# Patient Record
Sex: Male | Born: 1960 | Hispanic: No | Marital: Married | Smoking: Never smoker
Health system: Southern US, Community
[De-identification: ages and names within clinical notes are randomized; demographics above are authoritative.]

---

## 2014-10-27 ENCOUNTER — Ambulatory Visit: Payer: Medicaid Other | Attending: Internal Medicine | Admitting: Internal Medicine

## 2014-10-27 ENCOUNTER — Ambulatory Visit (HOSPITAL_COMMUNITY)
Admission: RE | Admit: 2014-10-27 | Discharge: 2014-10-27 | Disposition: A | Discharge status: Home / Self Care | Payer: Medicaid Other | Source: Ambulatory Visit | Attending: Internal Medicine | Admitting: Internal Medicine

## 2014-10-27 ENCOUNTER — Encounter: Payer: Self-pay | Admitting: Internal Medicine

## 2014-10-27 VITALS — BP 134/76 | HR 68 | Temp 98.0°F | Resp 16 | Wt 213.2 lb

## 2014-10-27 DIAGNOSIS — Z833 Family history of diabetes mellitus: Secondary | ICD-10-CM | POA: Diagnosis not present

## 2014-10-27 DIAGNOSIS — Z8249 Family history of ischemic heart disease and other diseases of the circulatory system: Secondary | ICD-10-CM | POA: Diagnosis not present

## 2014-10-27 DIAGNOSIS — Z0000 Encounter for general adult medical examination without abnormal findings: Secondary | ICD-10-CM | POA: Diagnosis present

## 2014-10-27 DIAGNOSIS — M25561 Pain in right knee: Secondary | ICD-10-CM | POA: Insufficient documentation

## 2014-10-27 DIAGNOSIS — L600 Ingrowing nail: Secondary | ICD-10-CM | POA: Diagnosis not present

## 2014-10-27 DIAGNOSIS — R351 Nocturia: Secondary | ICD-10-CM | POA: Insufficient documentation

## 2014-10-27 DIAGNOSIS — M79674 Pain in right toe(s): Secondary | ICD-10-CM | POA: Insufficient documentation

## 2014-10-27 DIAGNOSIS — M79676 Pain in unspecified toe(s): Secondary | ICD-10-CM

## 2014-10-27 DIAGNOSIS — G8929 Other chronic pain: Secondary | ICD-10-CM | POA: Diagnosis not present

## 2014-10-27 DIAGNOSIS — R350 Frequency of micturition: Secondary | ICD-10-CM | POA: Insufficient documentation

## 2014-10-27 DIAGNOSIS — Z139 Encounter for screening, unspecified: Secondary | ICD-10-CM

## 2014-10-27 DIAGNOSIS — Z1211 Encounter for screening for malignant neoplasm of colon: Secondary | ICD-10-CM

## 2014-10-27 LAB — CBC WITH DIFFERENTIAL/PLATELET
BASOS ABS: 0 10*3/uL (ref 0.0–0.1)
BASOS PCT: 0 % (ref 0–1)
EOS ABS: 0.2 10*3/uL (ref 0.0–0.7)
EOS PCT: 4 % (ref 0–5)
HEMATOCRIT: 40.2 % (ref 39.0–52.0)
Hemoglobin: 14.2 g/dL (ref 13.0–17.0)
Lymphocytes Relative: 46 % (ref 12–46)
Lymphs Abs: 2.2 10*3/uL (ref 0.7–4.0)
MCH: 30.5 pg (ref 26.0–34.0)
MCHC: 35.3 g/dL (ref 30.0–36.0)
MCV: 86.3 fL (ref 78.0–100.0)
Monocytes Absolute: 0.5 10*3/uL (ref 0.1–1.0)
Monocytes Relative: 10 % (ref 3–12)
Neutro Abs: 1.9 10*3/uL (ref 1.7–7.7)
Neutrophils Relative %: 40 % — ABNORMAL LOW (ref 43–77)
PLATELETS: 202 10*3/uL (ref 150–400)
RBC: 4.66 MIL/uL (ref 4.22–5.81)
RDW: 14.4 % (ref 11.5–15.5)
WBC: 4.8 10*3/uL (ref 4.0–10.5)

## 2014-10-27 LAB — LIPID PANEL
CHOL/HDL RATIO: 4.6 ratio
CHOLESTEROL: 231 mg/dL — AB (ref 0–200)
HDL: 50 mg/dL (ref >39)
LDL Cholesterol: 141 mg/dL — ABNORMAL HIGH (ref 0–99)
Triglycerides: 201 mg/dL — ABNORMAL HIGH (ref <150)
VLDL: 40 mg/dL (ref 0–40)

## 2014-10-27 LAB — COMPLETE METABOLIC PANEL WITH GFR
ALK PHOS: 56 U/L (ref 39–117)
ALT: 29 U/L (ref 0–53)
AST: 23 U/L (ref 0–37)
Albumin: 4.7 g/dL (ref 3.5–5.2)
BUN: 12 mg/dL (ref 6–23)
CALCIUM: 10 mg/dL (ref 8.4–10.5)
CHLORIDE: 102 meq/L (ref 96–112)
CO2: 25 mEq/L (ref 19–32)
Creat: 0.97 mg/dL (ref 0.50–1.35)
GFR, Est African American: >89 mL/min
GFR, Est Non African American: 89 mL/min
Glucose, Bld: 90 mg/dL (ref 70–99)
Potassium: 4.2 mEq/L (ref 3.5–5.3)
Sodium: 138 mEq/L (ref 135–145)
Total Bilirubin: 0.7 mg/dL (ref 0.2–1.2)
Total Protein: 7.2 g/dL (ref 6.0–8.3)

## 2014-10-27 LAB — HEMOGLOBIN A1C
HEMOGLOBIN A1C: 5.4 % (ref <5.7)
Mean Plasma Glucose: 108 mg/dL (ref <117)

## 2014-10-27 LAB — URIC ACID: Uric Acid, Serum: 6.9 mg/dL (ref 4.0–7.8)

## 2014-10-27 LAB — TSH: TSH: 3.059 u[IU]/mL (ref 0.350–4.500)

## 2014-10-27 MED ORDER — IBUPROFEN 600 MG PO TABS: 600.0000 mg | ORAL_TABLET | Freq: Three times a day (TID) | ORAL | Status: DC | PRN

## 2014-10-27 NOTE — Patient Instructions (Signed)
Ingrown Toenail An ingrown toenail occurs when the sharp edge of your toenail grows into the skin. Causes of ingrown toenails include toenails clipped too far back or poorly fitting shoes. Activities involving sudden stops (basketball, tennis) causing "toe jamming" may lead to an ingrown nail. HOME CARE INSTRUCTIONS   Soak the whole foot in warm soapy water for 20 minutes, 3 times per day.  You may lift the edge of the nail away from the sore skin by wedging a small piece of cotton under the corner of the nail. Be careful not to dig (traumatize) and cause more injury to the area.  Wear shoes that fit well. While the ingrown nail is causing problems, sandals may be beneficial.  Trim your toenails regularly and carefully. Cut your toenails straight across, not in a curve. This will prevent injury to the skin at the corners of the toenail.  Keep your feet clean and dry.  Crutches may be helpful early in treatment if walking is painful.  Antibiotics, if prescribed, should be taken as directed.  Return for a wound check in 2 days or as directed.  Only take over-the-counter or prescription medicines for pain, discomfort, or fever as directed by your caregiver. SEEK IMMEDIATE MEDICAL CARE IF:   You have a fever.  You have increasing pain, redness, swelling, or heat at the wound site.  Your toe is not better in 7 days. If conservative treatment is not successful, surgical removal of a portion or all of the nail may be necessary. MAKE SURE YOU:   Understand these instructions.  Will watch your condition.  Will get help right away if you are not doing well or get worse. Document Released: 12/05/2000 Document Revised: 03/01/2012 Document Reviewed: 11/29/2008 [employer] Patient Information 2015 [employer]. This information is not intended to replace advice given to you by your health care provider. Make sure you discuss any questions you have with your health care provider.  

## 2014-10-27 NOTE — Progress Notes (Signed)
Patient here to establish care Complains of pain to right knee and right great toe Takes no prescribed medications

## 2014-10-27 NOTE — Progress Notes (Signed)
Patient Demographics  Howard Cox, is a 53 y.o. male  ZOX:[id number]  [id number]  DOB - 06-Jun-1961  CC:  Chief Complaint  Patient presents with  . new patient       HPI: Howard Cox is a 53 y.o. male here today to establish medical care.patient reported to have right knee pain on and off for a few months, denies any fall or trauma does report history of trauma in his right foot and has swollen right big toe but denies any fever chills, denies any previous history of gout, patient does not take any medications, he reported to have some nocturia and urinary frequency denies any dysuria, patient does report family history of diabetes. Patient also complains of ingrown toenail in his right foot big toe. Patient has No headache, No chest pain, No abdominal pain - No Nausea, No new weakness tingling or numbness, No Cough - SOB.  No Known Allergies History reviewed. No pertinent past medical history. No current outpatient prescriptions on file prior to visit.   No current facility-administered medications on file prior to visit.   Family History  Problem Relation Age of Onset  . Diabetes Mother   . Heart disease Father   . Hypertension Father    History   Social History  . Marital Status: Married    Spouse Name: N/A    Number of Children: N/A  . Years of Education: N/A   Occupational History  . Not on file.   Social History Main Topics  . Smoking status: Never Smoker   . Smokeless tobacco: Not on file  . Alcohol Use: No  . Drug Use: Not on file  . Sexual Activity: Not on file   Other Topics Concern  . Not on file   Social History Narrative  . No narrative on file    Review of Systems: Constitutional: Negative for fever, chills, diaphoresis, activity change, appetite change and fatigue. HENT: Negative for ear pain, nosebleeds, congestion, facial swelling, rhinorrhea, neck pain, neck stiffness and ear discharge.  Eyes: Negative for pain, discharge,  redness, itching and visual disturbance. Respiratory: Negative for cough, choking, chest tightness, shortness of breath, wheezing and stridor.  Cardiovascular: Negative for chest pain, palpitations and leg swelling. Gastrointestinal: Negative for abdominal distention. Genitourinary: Negative for dysuria, urgency, frequency, hematuria, flank pain, decreased urine volume, difficulty urinating and dyspareunia.  Musculoskeletal: Negative for back pain, joint swelling, arthralgia and gait problem. Neurological: Negative for dizziness, tremors, seizures, syncope, facial asymmetry, speech difficulty, weakness, light-headedness, numbness and headaches.  Hematological: Negative for adenopathy. Does not bruise/bleed easily. Psychiatric/Behavioral: Negative for hallucinations, behavioral problems, confusion, dysphoric mood, decreased concentration and agitation.    Objective:   Filed Vitals:   10/27/14 1110  BP: 134/76  Pulse: 68  Temp: 98 F (36.7 C)  Resp: 16    Physical Exam: Constitutional: Patient appears well-developed and well-nourished. No distress. HENT: Normocephalic, atraumatic, External right and left ear normal. Oropharynx is clear and moist.  Eyes: Conjunctivae and EOM are normal. PERRLA, no scleral icterus. Neck: Normal ROM. Neck supple. No JVD. No tracheal deviation. No thyromegaly. CVS: RRR, S1/S2 +, no murmurs, no gallops, no carotid bruit.  Pulmonary: Effort and breath sounds normal, no stridor, rhonchi, wheezes, rales.  Abdominal: Soft. BS +, no distension, tenderness, rebound or guarding.  Musculoskeletal: Normal range of motion. No edema and no tenderness. Right foot big toe swollen compaired to left toe  no erythema or tenderness  Neuro: Alert. Normal reflexes, muscle tone coordination.  No cranial nerve deficit. Skin: Skin is warm and dry. No rash noted. Not diaphoretic. No erythema. No pallor. Psychiatric: Normal mood and affect. Behavior, judgment, thought content  normal.  No results found for: WBC, HGB, HCT, MCV, PLT No results found for: CREATININE, BUN, NA, K, CL, CO2  No results found for: HGBA1C Lipid Panel  No results found for: CHOL, TRIG, HDL, CHOLHDL, VLDL, LDLCALC     Assessment and plan:   1. Right knee pain  - ibuprofen (ADVIL,MOTRIN) 600 MG tablet; Take 1 tablet (600 mg total) by mouth every 8 (eight) hours as needed.  Dispense: 30 tablet; Refill: 1 - DG Knee Complete 4 Views Right; Future  2. Screening Ordered baseline blood work - CBC with Differential - COMPLETE METABOLIC PANEL WITH GFR - TSH - Lipid panel - Vit D  25 hydroxy (rtn osteoporosis monitoring) - PSA  3. Family history of diabetes mellitus (DM)  - Hemoglobin A1c  4. Nocturia  - Urinalysis, Routine w reflex microscopic  5. Ingrown right big toenail Soak the whole foot in warm soapy water for 20 minutes, 3 times per day  6. Toe pain, chronic, right  - Uric Acid - DG Toe Great Right; Future  7. Special screening for malignant neoplasms, colon  - Ambulatory referral to Gastroenterology      Health Maintenance -Colonoscopy: referred to GI As per patient he already had flu shot   Return in about 3 months (around 01/27/2015).     Doris CheadleADVANI, Gem Conkle, MD

## 2014-10-28 LAB — URINALYSIS, ROUTINE W REFLEX MICROSCOPIC
Bilirubin Urine: NEGATIVE
GLUCOSE, UA: NEGATIVE mg/dL
Hgb urine dipstick: NEGATIVE
Ketones, ur: NEGATIVE mg/dL
LEUKOCYTES UA: NEGATIVE
Nitrite: NEGATIVE
PROTEIN: NEGATIVE mg/dL
SPECIFIC GRAVITY, URINE: 1.015 (ref 1.005–1.030)
UROBILINOGEN UA: 0.2 mg/dL (ref 0.0–1.0)
pH: 5.5 (ref 5.0–8.0)

## 2014-10-28 LAB — VITAMIN D 25 HYDROXY (VIT D DEFICIENCY, FRACTURES): Vit D, 25-Hydroxy: 21 ng/mL — ABNORMAL LOW (ref 30–89)

## 2014-10-28 LAB — PSA: PSA: 2.57 ng/mL (ref <=4.00)

## 2014-11-01 ENCOUNTER — Telehealth: Payer: Self-pay | Admitting: *Deleted

## 2014-11-01 MED ORDER — VITAMIN D (ERGOCALCIFEROL) 1.25 MG (50000 UNIT) PO CAPS: 50000.0000 [IU] | ORAL_CAPSULE | ORAL | Status: AC

## 2014-11-01 NOTE — Telephone Encounter (Signed)
-----   Message from Deepak Advani, MD sent at 10/30/2014  9:21 AM EST ----- Blood work reviewed, noticed low vitamin D, call patient advise to start ergocalciferol 50,000 units once a week for the duration of  12 weeks. Also  noticed elevated cholesterol, advise patient for low fat diet.   

## 2014-11-01 NOTE — Telephone Encounter (Signed)
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

## 2014-11-01 NOTE — Telephone Encounter (Signed)
-----   Message from Doris Cheadleeepak Advani, MD sent at 10/30/2014 11:51 AM EST ----- Call and let the patient know that his right knee x-ray reported DJD no acute abnormality.

## 2014-11-01 NOTE — Telephone Encounter (Signed)
[facility] [phone] [facility] Ergocalciferol E-scribe to [facility]  Pt aware of lab results and x-ray results

## 2014-11-02 ENCOUNTER — Telehealth: Payer: Self-pay

## 2014-11-02 NOTE — Telephone Encounter (Signed)
-----   Message from Doris Cheadleeepak Advani, MD sent at 10/30/2014  9:21 AM EST ----- Blood work reviewed, noticed low vitamin D, call patient advise to start ergocalciferol 50,000 units once a week for the duration of  12 weeks. Also  noticed elevated cholesterol, advise patient for low fat diet.

## 2014-11-06 ENCOUNTER — Ambulatory Visit: Payer: Medicaid Other | Admitting: Internal Medicine

## 2014-11-10 ENCOUNTER — Ambulatory Visit: Payer: Medicaid Other | Admitting: Internal Medicine

## 2014-11-24 ENCOUNTER — Ambulatory Visit: Payer: Medicaid Other | Attending: Internal Medicine | Admitting: Internal Medicine

## 2014-11-24 ENCOUNTER — Encounter: Payer: Self-pay | Admitting: Internal Medicine

## 2014-11-24 VITALS — BP 126/81 | HR 68 | Temp 97.8°F | Resp 16 | Wt 213.0 lb

## 2014-11-24 DIAGNOSIS — E7800 Pure hypercholesterolemia, unspecified: Secondary | ICD-10-CM

## 2014-11-24 DIAGNOSIS — M1711 Unilateral primary osteoarthritis, right knee: Secondary | ICD-10-CM | POA: Insufficient documentation

## 2014-11-24 DIAGNOSIS — Z1211 Encounter for screening for malignant neoplasm of colon: Secondary | ICD-10-CM

## 2014-11-24 DIAGNOSIS — E559 Vitamin D deficiency, unspecified: Secondary | ICD-10-CM | POA: Diagnosis not present

## 2014-11-24 DIAGNOSIS — M25561 Pain in right knee: Secondary | ICD-10-CM

## 2014-11-24 MED ORDER — IBUPROFEN 600 MG PO TABS: 600.0000 mg | ORAL_TABLET | Freq: Three times a day (TID) | ORAL | Status: AC | PRN

## 2014-11-24 NOTE — Patient Instructions (Signed)

## 2014-11-24 NOTE — Progress Notes (Signed)
Patient here with interpreter Here for follow up on his right knee pain and routine check up

## 2014-11-24 NOTE — Progress Notes (Signed)
MRN: [id number] Name: Howard Cox  Sex: male Age: 53 y.o. DOB: [birth year]  Allergies: Review of patient's allergies indicates no known allergies.  Chief Complaint  Patient presents with  . Follow-up    HPI: Patient is 53 y.o. male who comes today for followup, had a blood work done which was reviewed with the patient, noticed to have vitamin D deficiency, he also had right knee x-ray done as well as right foot x-ray done which reported DJD, patient is requesting prescription for pain medication, patient is accurate as flu shot. History reviewed. No pertinent past medical history.  History reviewed. No pertinent past surgical history.    Medication List       This list is accurate as of: 11/24/14  9:49 AM.  Always use your most recent med list.               ibuprofen 600 MG tablet  Commonly known as:  ADVIL,MOTRIN  Take 1 tablet (600 mg total) by mouth every 8 (eight) hours as needed.     Vitamin D (Ergocalciferol) 50000 UNITS Caps capsule  Commonly known as:  DRISDOL  Take 1 capsule (50,000 Units total) by mouth every 7 (seven) days.        Meds ordered this encounter  Medications  . ibuprofen (ADVIL,MOTRIN) 600 MG tablet    Sig: Take 1 tablet (600 mg total) by mouth every 8 (eight) hours as needed.    Dispense:  30 tablet    Refill:  3     There is no immunization history on file for this patient.  Family History  Problem Relation Age of Onset  . Diabetes Mother   . Heart disease Father   . Hypertension Father     History  Substance Use Topics  . Smoking status: Never Smoker   . Smokeless tobacco: Not on file  . Alcohol Use: No    Review of Systems   As noted in HPI  Filed Vitals:   11/24/14 0901  BP: 126/81  Pulse: 68  Temp: 97.8 F (36.6 C)  Resp: 16    Physical Exam  Physical Exam  Constitutional: No distress.  Eyes: EOM are normal. Pupils are equal, round, and reactive to light.  Cardiovascular: Normal rate and regular  rhythm.   Pulmonary/Chest: Breath sounds normal. No respiratory distress. He has no wheezes. He has no rales.  Musculoskeletal: He exhibits no edema.    CBC    Component Value Date/Time   WBC 4.8 10/27/2014 1154   RBC 4.66 10/27/2014 1154   HGB 14.2 10/27/2014 1154   HCT 40.2 10/27/2014 1154   PLT 202 10/27/2014 1154   MCV 86.3 10/27/2014 1154   LYMPHSABS 2.2 10/27/2014 1154   MONOABS 0.5 10/27/2014 1154   EOSABS 0.2 10/27/2014 1154   BASOSABS 0.0 10/27/2014 1154    CMP     Component Value Date/Time   NA 138 10/27/2014 1154   K 4.2 10/27/2014 1154   CL 102 10/27/2014 1154   CO2 25 10/27/2014 1154   GLUCOSE 90 10/27/2014 1154   BUN 12 10/27/2014 1154   CREATININE 0.97 10/27/2014 1154   CALCIUM 10.0 10/27/2014 1154   PROT 7.2 10/27/2014 1154   ALBUMIN 4.7 10/27/2014 1154   AST 23 10/27/2014 1154   ALT 29 10/27/2014 1154   ALKPHOS 56 10/27/2014 1154   BILITOT 0.7 10/27/2014 1154   GFRNONAA 89 10/27/2014 1154   GFRAA >89 10/27/2014 1154    Lab Results  Component  Value Date/Time   CHOL 231* 10/27/2014 11:54 AM    No components found for: HGA1C  Lab Results  Component Value Date/Time   AST 23 10/27/2014 11:54 AM    Assessment and Plan  Right knee pain - Plan:patient has DJD, prescribed  ibuprofen (ADVIL,MOTRIN) 600 MG tablet to use when necessary for pain  Vitamin D deficiency pateint has been prescribed Vitamin D   High cholesterol Advised patient for low fat diet   Special screening for malignant neoplasms, Howard - Plan: Ambulatory referral to Gastroenterology   Health Maintenance -Colonoscopy: referred to GI   -Vaccinations: uptodate with flu shot   Return in about 3 months (around 02/23/2015).  Doris CheadleADVANI, Ashante Snelling, MD

## 2014-12-29 ENCOUNTER — Encounter: Payer: Self-pay | Admitting: Internal Medicine

## 2015-07-28 IMAGING — CR DG KNEE COMPLETE 4+V*R*
4 series · 4 of 4 positions shown · non-contrast
Comparison: None.

CLINICAL DATA: Right knee pain x2 months.  Initial evaluation.

EXAM:
RIGHT KNEE - COMPLETE 4+ VIEW

[t knee ap right]
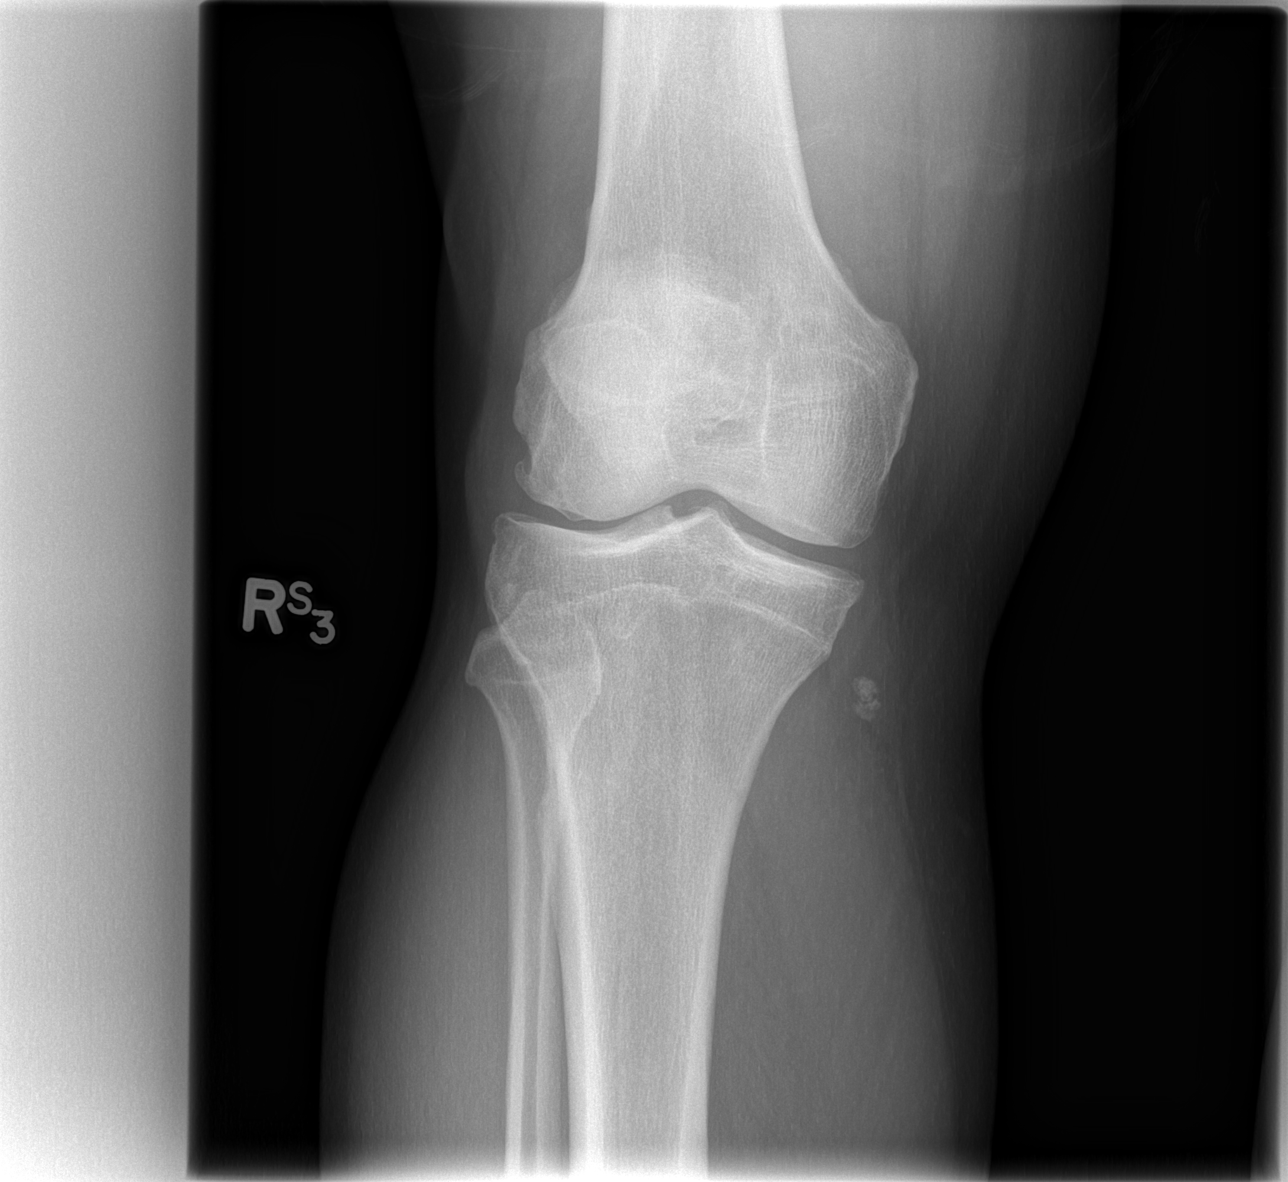

[t knee oblique right (1 of 2)]
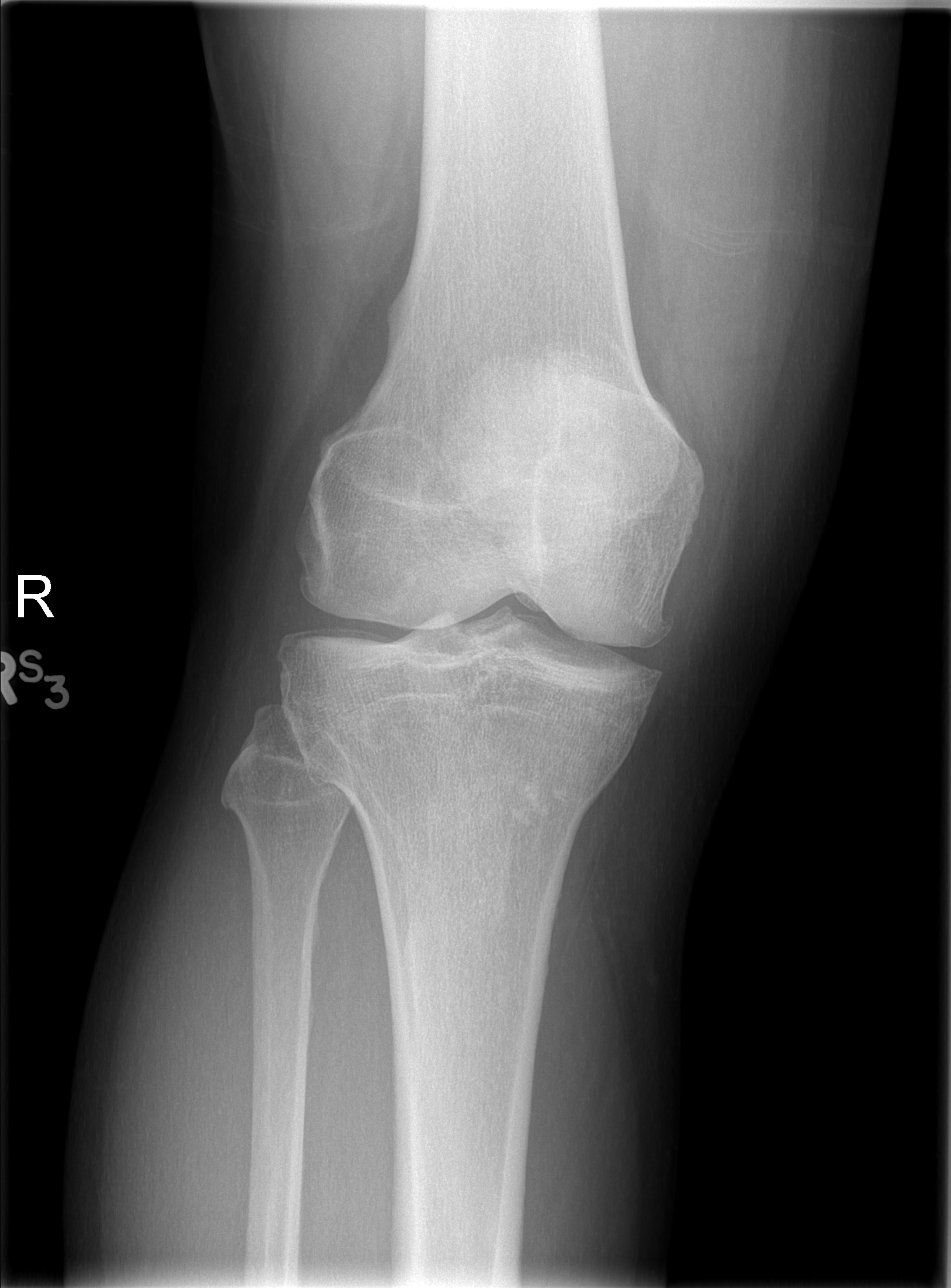

[t knee oblique right (2 of 2)]
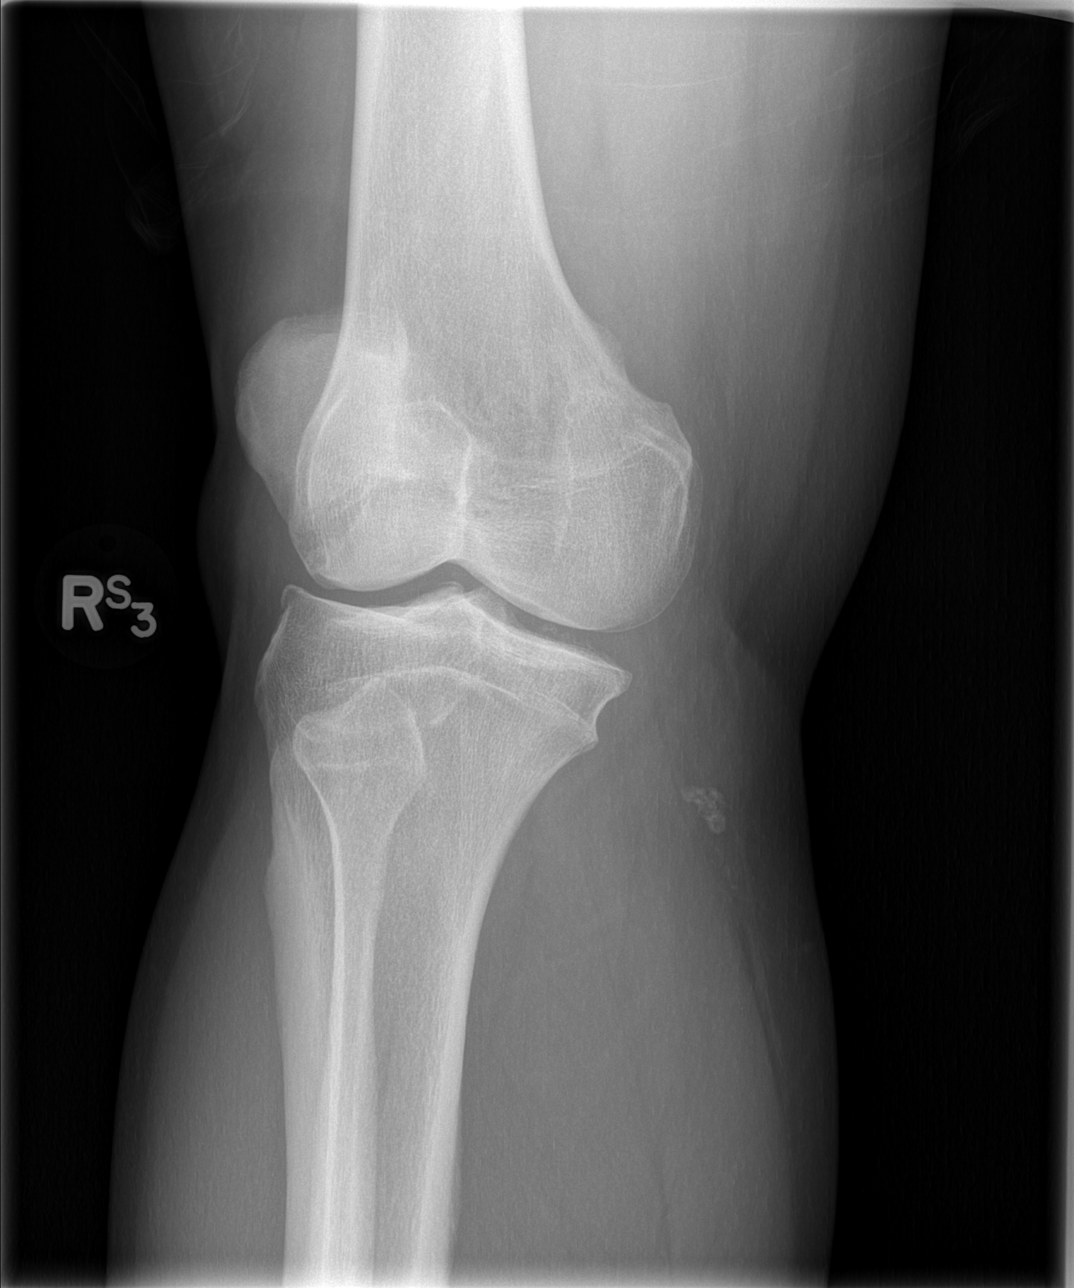

[t knee lat right]
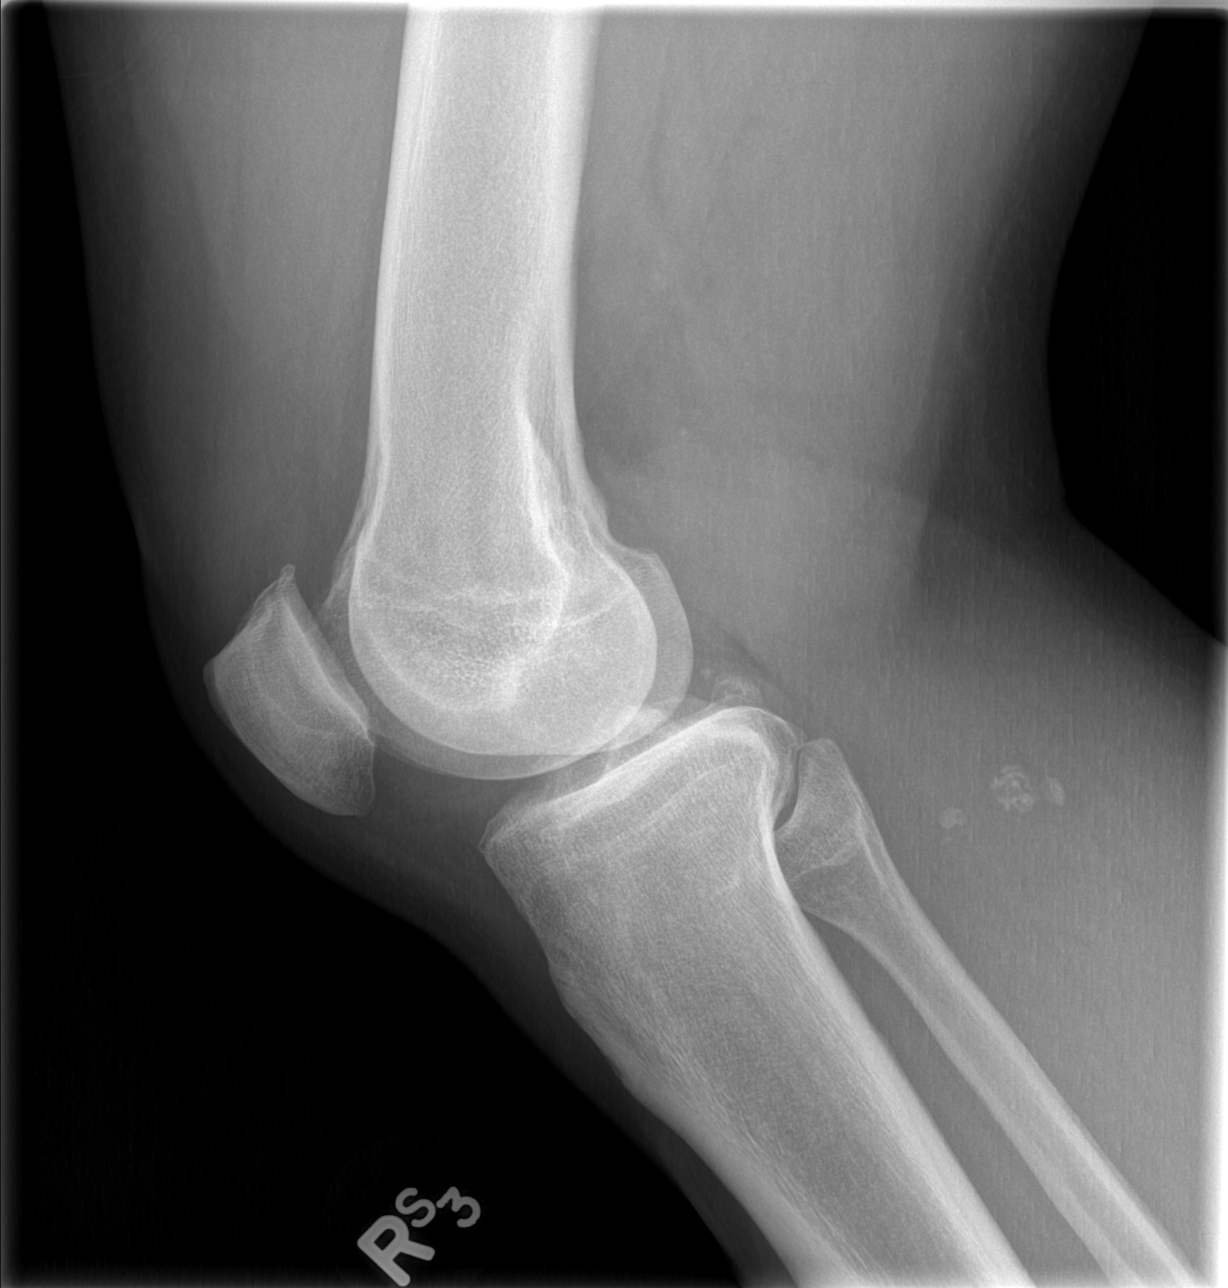

[4 of 4 positions shown; findings below may reference images not displayed]

FINDINGS: Benign-appearing soft tissue calcification noted over the medial
proximal right lower extremity. Tricompartment degenerative change.
Mild chondrocalcinosis, most likely degenerative . Small loose
bodies. No evidence of acute fracture or dislocation.
IMPRESSION: Tricompartment degenerative change.  No acute abnormality.

## 2020-01-10 ENCOUNTER — Other Ambulatory Visit: Admission: RE | Admit: 2020-01-10 | Payer: Self-pay | Source: Ambulatory Visit

## 2022-04-09 ENCOUNTER — Ambulatory Visit: Payer: BLUE CROSS/BLUE SHIELD | Admitting: Podiatry
